# Patient Record
Sex: Female | Born: 1960 | Race: White | Hispanic: No | Marital: Married | Smoking: Never smoker
Health system: Southern US, Community
[De-identification: ages and names within clinical notes are randomized; demographics above are authoritative.]

## PROBLEM LIST (undated history)

## (undated) HISTORY — PX: TONSILLECTOMY: SUR1361

## (undated) HISTORY — PX: TUBAL LIGATION: SHX77

## (undated) HISTORY — PX: APPENDECTOMY: SHX54

## (undated) HISTORY — PX: ABDOMINAL HYSTERECTOMY: SHX81

---

## 2009-11-25 ENCOUNTER — Ambulatory Visit: Payer: Self-pay | Admitting: Family Medicine

## 2009-11-25 DIAGNOSIS — R509 Fever, unspecified: Secondary | ICD-10-CM

## 2009-11-25 DIAGNOSIS — R5381 Other malaise: Secondary | ICD-10-CM

## 2009-11-25 DIAGNOSIS — R5383 Other fatigue: Secondary | ICD-10-CM

## 2009-11-29 LAB — CONVERTED CEMR LAB
Basophils Absolute: 0 10*3/uL (ref 0.0–0.1)
Eosinophils Relative: 2 % (ref 0–5)
HCT: 40 % (ref 36.0–46.0)
Lymphocytes Relative: 43 % (ref 12–46)
Lymphs Abs: 2.9 10*3/uL (ref 0.7–4.0)
Neutro Abs: 3.2 10*3/uL (ref 1.7–7.7)
Neutrophils Relative %: 48 % (ref 43–77)
Platelets: 220 10*3/uL (ref 150–400)
RDW: 13.4 % (ref 11.5–15.5)
WBC: 6.8 10*3/uL (ref 4.0–10.5)

## 2010-06-07 NOTE — Assessment & Plan Note (Signed)
Summary: TICK BITE   Vital Signs:  Patient Profile:   50 Years Old Female CC:      Tick Bite/rwt Height:     63.5 inches Weight:      169 pounds O2 Sat:      98 % O2 treatment:    Room Air Temp:     97.0 degrees F oral Pulse rate:   66 / minute Pulse rhythm:   regular Resp:     18 per minute BP sitting:   124 / 67  (right arm)  Pt. in pain?   no              Is Patient Diabetic? No      Current Allergies: ! SULFAHistory of Present Illness History from: patient Reason for visit: see chief complaint Chief Complaint: Tick Bite/rwt History of Present Illness: Noticed a pruitic area on her right flank below her breast. She thought it was a bug bite. Several days later she had her husband look at it and he reports that it was a deer tick. This all occurred about 3 weeks ago. For the last week she has been feeling run down, and achy. She reports that she had working out in the garden, but no out of state travel.   Also reports a stiff neck, and sorethroat. She noted that earlier in this course there was redness around the area.   REVIEW OF SYSTEMS Constitutional Symptoms       Complains of fever, chills, night sweats, and fatigue.     Denies weight loss and weight gain.  Eyes       Denies change in vision, eye pain, eye discharge, glasses, contact lenses, and eye surgery. Ear/Nose/Throat/Mouth       Complains of dizziness.      Denies hearing loss/aids, change in hearing, ear pain, ear discharge, frequent runny nose, frequent nose bleeds, sinus problems, sore throat, hoarseness, and tooth pain or bleeding.      Comments: lightheadedness occ  Respiratory       Denies dry cough, productive cough, wheezing, shortness of breath, asthma, bronchitis, and emphysema/COPD.  Cardiovascular       Denies murmurs, chest pain, and tires easily with exhertion.    Gastrointestinal       Denies stomach pain, nausea/vomiting, diarrhea, constipation, blood in bowel movements, and  indigestion. Genitourniary       Denies painful urination, kidney stones, and loss of urinary control. Neurological       Denies paralysis, seizures, and fainting/blackouts. Musculoskeletal       Complains of muscle pain and joint stiffness.      Denies joint pain, decreased range of motion, redness, swelling, muscle weakness, and gout.      Comments: neck and shoulder stiffness - achy feeling Skin       Denies bruising, unusual mles/lumps or sores, and hair/skin or nail changes.  Psych       Denies mood changes, temper/anger issues, anxiety/stress, speech problems, depression, and sleep problems. Physical Exam General appearance: well developed, well nourished, no acute distress Pupils: equal, round, reactive to light Oral/Pharynx: pharyngeal erythema without exudate, uvula midline without deviation Neck: neck supple,  trachea midline, no masses + bilateral ant cervical lymphadenopathy Chest/Lungs: no rales, wheezes, or rhonchi bilateral, breath sounds equal without effort Heart: regular rate and  rhythm, no murmur Abdomen: soft, non-tender without obvious organomegaly Skin: small white papule with no surrounding erythema on right flank under her right breast. no other rash appreciated.  MSE: oriented to time, place, and person Assessment New Problems: FEVER UNSPECIFIED (ICD-780.60) TICK BITE (ICD-E906.4) MALAISE AND FATIGUE (ICD-780.79)   Plan New Medications/Changes: AZITHROMYCIN 250 MG TABS (AZITHROMYCIN) 2 by mouth day one then 1 by mouth day 2-10  #11 x 0, 11/25/2009, Tacey Ruiz MD  New Orders: T-CBC w/Diff [[id number]] T-Lyme Disease [14782-95621] New Patient Level III [30865] Venipuncture [78469]  The patient and/or caregiver has been counseled thoroughly with regard to medications prescribed including dosage, schedule, interactions, rationale for use, and possible side effects and they verbalize understanding.  Diagnoses and expected course of recovery discussed and  will return if not improved as expected or if the condition worsens. Patient and/or caregiver verbalized understanding.  Prescriptions: AZITHROMYCIN 250 MG TABS (AZITHROMYCIN) 2 by mouth day one then 1 by mouth day 2-10  #11 x 0   Entered and Authorized by:   Tacey Ruiz MD   Signed by:   Tacey Ruiz MD on 11/25/2009   Method used:   Electronically to        [facility] [phone]* (retail)       [address]       [place], [place]  [address]       Ph: [phone]       Fax: [phone]   RxID:   [id number]   Patient Instructions: 1)  We did blood work today looking at your blood count and to check for lyme disease. If you continue to feel badly after 2-3 days then return or follow up with your primary care doctor.  2)  Especially look for continued aches, high fever, dark urine, shortness of breath.  3)  Take your antibiotic as prescribed until ALL of it is gone, but stop if you develop a rash or swelling and contact our office as soon as possible.  Orders Added: 1)  T-CBC w/Diff [[id number]] 2)  T-Lyme Disease [01601-09323] 3)  New Patient Level III [55732] 4)  Venipuncture [20254]  The patient was informed that there is no on-call provider or services available at this clinic during off-hours (when the clinic is closed).  If the patient developed a problem or concern that required immediate attention, the patient was advised to go the the nearest available urgent care or emergency department for medical care.  The patient verbalized understanding.    The risks, benefits and possible side effects of the treatments and tests were explained clearly to the patient and the patient verbalized understanding.

## 2010-11-01 ENCOUNTER — Ambulatory Visit: Payer: Self-pay | Admitting: Family Medicine

## 2014-11-25 ENCOUNTER — Ambulatory Visit
Admission: EM | Admit: 2014-11-25 | Discharge: 2014-11-25 | Disposition: A | Discharge status: Home / Self Care | Payer: BC Managed Care – PPO | Attending: Internal Medicine | Admitting: Internal Medicine

## 2014-11-25 ENCOUNTER — Encounter: Payer: Self-pay | Admitting: Emergency Medicine

## 2014-11-25 DIAGNOSIS — J0110 Acute frontal sinusitis, unspecified: Secondary | ICD-10-CM | POA: Diagnosis not present

## 2014-11-25 MED ORDER — AMOXICILLIN-POT CLAVULANATE 875-125 MG PO TABS: 1.0000 | ORAL_TABLET | Freq: Two times a day (BID) | ORAL | Status: DC

## 2014-11-25 MED ORDER — PREDNISONE 50 MG PO TABS: 50.0000 mg | ORAL_TABLET | Freq: Every day | ORAL | Status: DC

## 2014-11-25 NOTE — ED Provider Notes (Addendum)
CSN: [id number]     Arrival date & time 11/25/14  1852 History   First MD Initiated Contact with Patient 11/25/14 2028     Chief Complaint  Patient presents with  . Facial Pain   HPI Patient is a 54 year old lady who presents with more than a 2 week history of sinus congestion/facial pressure, tactile temps, little bit of sore throat.  Cough. Runny/congested nose. No nausea/vomiting, no diarrhea. Headache. Now her chest is starting to feel congested.  History reviewed. No pertinent past medical history. Past Surgical History  Procedure Laterality Date  . Abdominal hysterectomy    . Cesarean section    . Tonsillectomy    . Tubal ligation    . Appendectomy     No family history on file. History  Substance Use Topics  . Smoking status: Never Smoker   . Smokeless tobacco: Never Used  . Alcohol Use: No   Review of Systems  All other systems reviewed and are negative.   Allergies  Sulfur  Home Medications   Prior to Admission medications   Medication Sig Start Date End Date Taking? Authorizing Provider  ALPRAZolam Prudy Feeler(XANAX) 0.5 MG tablet Take 0.5 mg by mouth at bedtime as needed for anxiety.   Yes Historical Provider, MD  aspirin 81 MG tablet Take 81 mg by mouth daily.   Yes Historical Provider, MD  Astaxanthin 4 MG CAPS Take 1 capsule by mouth once.   Yes Historical Provider, MD  Azelastine-Fluticasone (DYMISTA) 137-50 MCG/ACT SUSP Place 1 spray into the nose 2 (two) times daily.   Yes Historical Provider, MD  Cinnamon 500 MG capsule Take 500 mg by mouth daily.   Yes Historical Provider, MD  estradiol (CLIMARA - DOSED IN MG/24 HR) 0.025 mg/24hr patch Place 0.025 mg onto the skin once a week.   Yes Historical Provider, MD  Fish Oil-Cholecalciferol (FISH OIL + D3) 1000-1000 MG-UNIT CAPS Take by mouth.   Yes Historical Provider, MD  loratadine (CLARITIN) 10 MG tablet Take 10 mg by mouth daily.   Yes Historical Provider, MD  Multiple Vitamin (MULTIVITAMIN) tablet Take 2 tablets by  mouth daily.   Yes Historical Provider, MD  Red Yeast Rice Extract 600 MG CAPS Take 1 capsule by mouth once.   Yes Historical Provider, MD  Turmeric Curcumin 500 MG CAPS Take 1 capsule by mouth once.   Yes Historical Provider, MD   BP 105/46 mmHg  Pulse 64  Temp(Src) 97.7 F (36.5 C) (Tympanic)  Resp 16  Ht 5\' 4"  (1.626 m)  Wt 170 lb (77.111 kg)  BMI 29.17 kg/m2  SpO2 100% Physical Exam  Constitutional: She is oriented to person, place, and time. No distress.  Alert, nicely groomed  HENT:  Head: Atraumatic.  TMs bilaterally very dull, no erythema Marked nasal congestion, nearly occluded. Mucopurulent material present Throat pink with postnasal drainage evident  Eyes:  Conjugate gaze, no eye redness/drainage  Neck: Neck supple.  Cardiovascular: Normal rate and regular rhythm.   Pulmonary/Chest: No respiratory distress. She has no wheezes. She has no rales.  Lungs clear, symmetric breath sounds  Abdominal: She exhibits no distension.  Musculoskeletal: Normal range of motion.  No leg swelling  Neurological: She is alert and oriented to person, place, and time.  Skin: Skin is warm and dry.  No cyanosis  Nursing note and vitals reviewed.   ED Course  Procedures  No procedure at the urgent care today  MDM   1. Acute frontal sinusitis, recurrence not specified  Prescription for Augmentin and a couple doses of prednisone sent to the pharmacy. Anticipate slow improvement over the next several days. Recheck for new fever greater than 100.5, increasing phlegm production.    Eustace Moore, MD 11/26/14 1659  Eustace Moore, MD 11/26/14 1700

## 2014-11-25 NOTE — Discharge Instructions (Signed)
Prescriptions for augmentin and prednisone were sent to the pharmacy. Recheck if not improving in several days.  Sinusitis Sinusitis is redness, soreness, and puffiness (inflammation) of the air pockets in the bones of your face (sinuses). The redness, soreness, and puffiness can cause air and mucus to get trapped in your sinuses. This can allow germs to grow and cause an infection.  HOME CARE   Drink enough fluids to keep your pee (urine) clear or pale yellow.  Use a humidifier in your home.  Run a hot shower to create steam in the bathroom. Sit in the bathroom with the door closed. Breathe in the steam 3-4 times a day.  Put a warm, moist washcloth on your face 3-4 times a day, or as told by your doctor.  Use salt water sprays (saline sprays) to wet the thick fluid in your nose. This can help the sinuses drain.  Only take medicine as told by your doctor. GET HELP RIGHT AWAY IF:   Your pain gets worse.  You have very bad headaches.  You are sick to your stomach (nauseous).  You throw up (vomit).  You are very sleepy (drowsy) all the time.  Your face is puffy (swollen).  Your vision changes.  You have a stiff neck.  You have trouble breathing. MAKE SURE YOU:   Understand these instructions.  Will watch your condition.  Will get help right away if you are not doing well or get worse. Document Released: 10/11/2007 Document Revised: 01/17/2012 Document Reviewed: 11/28/2011 [facility] Patient Information 2015 [employer], [place]. This information is not intended to replace advice given to you by your health care provider. Make sure you discuss any questions you have with your health care provider.

## 2014-11-25 NOTE — ED Notes (Signed)
Sinus infection, facial pain, cough for 3 days

## 2020-06-21 ENCOUNTER — Other Ambulatory Visit: Payer: Self-pay

## 2020-06-21 DIAGNOSIS — Z20822 Contact with and (suspected) exposure to covid-19: Secondary | ICD-10-CM

## 2020-06-23 LAB — SARS-COV-2, NAA 2 DAY TAT

## 2020-06-23 LAB — NOVEL CORONAVIRUS, NAA: SARS-CoV-2, NAA: NOT DETECTED

## 2023-05-06 ENCOUNTER — Ambulatory Visit
Admission: EM | Admit: 2023-05-06 | Discharge: 2023-05-06 | Disposition: A | Discharge status: Home / Self Care | Payer: BC Managed Care – PPO | Attending: Emergency Medicine | Admitting: Emergency Medicine

## 2023-05-06 ENCOUNTER — Encounter: Payer: Self-pay | Admitting: Emergency Medicine

## 2023-05-06 DIAGNOSIS — J0140 Acute pansinusitis, unspecified: Secondary | ICD-10-CM | POA: Diagnosis not present

## 2023-05-06 MED ORDER — AMOXICILLIN-POT CLAVULANATE 875-125 MG PO TABS: 1.0000 | ORAL_TABLET | Freq: Two times a day (BID) | ORAL | 0 refills | Status: AC

## 2023-05-06 NOTE — ED Triage Notes (Signed)
Patient c/o sinus congestion and pressure and drainage that started a week ago.  Patient reports on Thursday her symptoms started to get worse with left sinus pain and congestion.  Patient denies fevers.

## 2023-05-06 NOTE — ED Provider Notes (Signed)
[facility]    CSN: [id number] Arrival date & time: 05/06/23  0801      History   Chief Complaint Chief Complaint  Patient presents with   Sinus Problem    HPI Penny Peters is a 62 y.o. female.   HPI  62 year old female with a past medical history significant for seasonal allergies and anxiety presents for evaluation of 1 week worth of sinus pain and pressure.  She states that she has not had a fever but she has been experiencing chills.  Symptoms began 1 week ago and intensified 3 days ago.  She describes her nasal discharge as a dark brown and most of her pain and pressure is isolated to the left.  She also has ear pain which is worse on the left than the right.  Nonproductive cough is also present.  No fever or sore throat.  History reviewed. No pertinent past medical history.  Patient Active Problem List   Diagnosis Date Noted   FEVER UNSPECIFIED 11/25/2009   MALAISE AND FATIGUE 11/25/2009    Past Surgical History:  Procedure Laterality Date   ABDOMINAL HYSTERECTOMY     APPENDECTOMY     CESAREAN SECTION     TONSILLECTOMY     TUBAL LIGATION      OB History   No obstetric history on file.      Home Medications    Prior to Admission medications   Medication Sig Start Date End Date Taking? Authorizing Provider  amoxicillin-clavulanate (AUGMENTIN) 875-125 MG tablet Take 1 tablet by mouth every 12 (twelve) hours for 10 days. 05/06/23 05/16/23 Yes Becky Augusta, NP  ALPRAZolam Prudy Feeler) 0.5 MG tablet Take 0.5 mg by mouth at bedtime as needed for anxiety.    [provider]  Astaxanthin 4 MG CAPS Take 1 capsule by mouth once.    [provider]  Azelastine-Fluticasone (DYMISTA) 137-50 MCG/ACT SUSP Place 1 spray into the nose 2 (two) times daily.    [provider]  Cinnamon 500 MG capsule Take 500 mg by mouth daily.    [provider]  Fish Oil-Cholecalciferol (FISH OIL + D3) 1000-1000 MG-UNIT CAPS Take by mouth.     [provider]  loratadine (CLARITIN) 10 MG tablet Take 10 mg by mouth daily.    [provider]  Multiple Vitamin (MULTIVITAMIN) tablet Take 2 tablets by mouth daily.    [provider]  Red Yeast Rice Extract 600 MG CAPS Take 1 capsule by mouth once.    [provider]  Turmeric Curcumin 500 MG CAPS Take 1 capsule by mouth once.    [provider]    Family History History reviewed. No pertinent family history.  Social History Social History   Tobacco Use   Smoking status: Never   Smokeless tobacco: Never  Vaping Use   Vaping status: Never Used  Substance Use Topics   Alcohol use: No   Drug use: No     Allergies   Elemental sulfur and Sulfa antibiotics   Review of Systems Review of Systems  Constitutional:  Positive for chills. Negative for fever.  HENT:  Positive for congestion, ear pain, rhinorrhea and sinus pain. Negative for sore throat.   Respiratory:  Positive for cough. Negative for shortness of breath and wheezing.      Physical Exam Triage Vital Signs ED Triage Vitals  Encounter Vitals Group     BP      Systolic BP Percentile      Diastolic  BP Percentile      Pulse      Resp      Temp      Temp src      SpO2      Weight      Height      Head Circumference      Peak Flow      Pain Score      Pain Loc      Pain Education      Exclude from Growth Chart    No data found.  Updated Vital Signs BP 110/65 (BP Location: Left Arm)   Pulse 88   Temp 98.4 F (36.9 C) (Oral)   Resp 14   Ht 5\' 4"  (1.626 m)   Wt 169 lb 15.6 oz (77.1 kg)   SpO2 97%   BMI 29.18 kg/m   Visual Acuity Right Eye Distance:   Left Eye Distance:   Bilateral Distance:    Right Eye Near:   Left Eye Near:    Bilateral Near:     Physical Exam Vitals and nursing note reviewed.  Constitutional:      Appearance: Normal appearance. She is not ill-appearing.  HENT:     Head: Normocephalic and atraumatic.     Right Ear:  Tympanic membrane, ear canal and external ear normal. There is no impacted cerumen.     Left Ear: Tympanic membrane, ear canal and external ear normal. There is no impacted cerumen.     Nose: Congestion and rhinorrhea present.     Comments: Nasal mucosa is erythematous and edematous with scant yellow discharge in both nares.  Bilateral frontal and maxillary sinuses are tender to compression.    Mouth/Throat:     Mouth: Mucous membranes are moist.     Pharynx: Oropharynx is clear. No oropharyngeal exudate or posterior oropharyngeal erythema.  Cardiovascular:     Rate and Rhythm: Normal rate and regular rhythm.     Pulses: Normal pulses.     Heart sounds: Normal heart sounds. No murmur heard.    No friction rub. No gallop.  Pulmonary:     Effort: Pulmonary effort is normal.     Breath sounds: Normal breath sounds. No wheezing, rhonchi or rales.  Musculoskeletal:     Cervical back: Normal range of motion and neck supple. No tenderness.  Lymphadenopathy:     Cervical: No cervical adenopathy.  Skin:    General: Skin is warm and dry.     Capillary Refill: Capillary refill takes less than 2 seconds.     Findings: No rash.  Neurological:     General: No focal deficit present.     Mental Status: She is alert and oriented to person, place, and time.      UC Treatments / Results  Labs (all labs ordered are listed, but only abnormal results are displayed) Labs Reviewed - No data to display  EKG   Radiology No results found.  Procedures Procedures (including critical care time)  Medications Ordered in UC Medications - No data to display  Initial Impression / Assessment and Plan / UC Course  I have reviewed the triage vital signs and the nursing notes.  Pertinent labs & imaging results that were available during my care of the patient were reviewed by me and considered in my medical decision making (see chart for details).   Patient is a pleasant, nontoxic-appearing 62 year old  female returning for evaluation a week worth of respiratory symptoms as outlined HPI above.  Her  primary complaint is sinus pain and pressure with a dark brown nasal discharge.  On exam she does have inflammation of her upper respiratory tract and tenderness to compression of bilateral maxillary sinuses.  Purulent discharge is present in both nares.  Cardiopulmonary dam is benign.  I will discharge the patient home with a diagnosis of pansinusitis on Augmentin 875 mg twice daily with food for 10 days.  We discussed using sinus irrigation to help alleviate her mucus burden along with Mucinex to help break up the stickiness of her mucus.  Return precautions reviewed.   Final Clinical Impressions(s) / UC Diagnoses   Final diagnoses:  Acute non-recurrent pansinusitis     Discharge Instructions      The Augmentin twice daily with food for 10 days for treatment of your sinusitis.  Perform sinus irrigation 2-3 times a day with a [employer]Med sinus rinse kit and distilled water.  Do not use tap water.  You can use plain over-the-counter Mucinex every 6 hours to break up the stickiness of the mucus so your body can clear it.  Increase your oral fluid intake to thin out your mucus so that is also able for your body to clear more easily.  Take an over-the-counter probiotic, such as Culturelle-align-activia, 1 hour after each dose of antibiotic to prevent diarrhea.  If you develop any new or worsening symptoms return for reevaluation or see your primary care provider.      ED Prescriptions     Medication Sig Dispense Auth. Provider   amoxicillin-clavulanate (AUGMENTIN) 875-125 MG tablet Take 1 tablet by mouth every 12 (twelve) hours for 10 days. 20 tablet Becky Augusta, NP      PDMP not reviewed this encounter.   Becky Augusta, NP 05/06/23 [phone]

## 2023-05-06 NOTE — Discharge Instructions (Addendum)
The Augmentin twice daily with food for 10 days for treatment of your sinusitis.  Perform sinus irrigation 2-3 times a day with a NeilMed sinus rinse kit and distilled water.  Do not use tap water.  You can use plain over-the-counter Mucinex every 6 hours to break up the stickiness of the mucus so your body can clear it.  Increase your oral fluid intake to thin out your mucus so that is also able for your body to clear more easily.  Take an over-the-counter probiotic, such as Culturelle-align-activia, 1 hour after each dose of antibiotic to prevent diarrhea.  If you develop any new or worsening symptoms return for reevaluation or see your primary care provider.
